# Patient Record
Sex: Female | Born: 1995 | Race: Black or African American | Hispanic: No | Marital: Single | Smoking: Never smoker
Health system: Southern US, Community
[De-identification: ages and names within clinical notes are randomized; demographics above are authoritative.]

## PROBLEM LIST (undated history)

## (undated) DIAGNOSIS — F909 Attention-deficit hyperactivity disorder, unspecified type: Secondary | ICD-10-CM

## (undated) DIAGNOSIS — J45909 Unspecified asthma, uncomplicated: Secondary | ICD-10-CM

## (undated) DIAGNOSIS — E0590 Thyrotoxicosis, unspecified without thyrotoxic crisis or storm: Secondary | ICD-10-CM

## (undated) DIAGNOSIS — K219 Gastro-esophageal reflux disease without esophagitis: Secondary | ICD-10-CM

## (undated) DIAGNOSIS — Z889 Allergy status to unspecified drugs, medicaments and biological substances status: Secondary | ICD-10-CM

## (undated) HISTORY — PX: TONSILECTOMY, ADENOIDECTOMY, BILATERAL MYRINGOTOMY AND TUBES: SHX2538

---
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
## 2017-06-09 ENCOUNTER — Encounter (HOSPITAL_BASED_OUTPATIENT_CLINIC_OR_DEPARTMENT_OTHER): Payer: Self-pay | Admitting: Emergency Medicine

## 2017-06-09 ENCOUNTER — Emergency Department (HOSPITAL_BASED_OUTPATIENT_CLINIC_OR_DEPARTMENT_OTHER)
Admission: EM | Admit: 2017-06-09 | Discharge: 2017-06-09 | Disposition: A | Discharge status: Home / Self Care | Payer: No Typology Code available for payment source | Attending: Emergency Medicine | Admitting: Emergency Medicine

## 2017-06-09 DIAGNOSIS — M5432 Sciatica, left side: Secondary | ICD-10-CM | POA: Insufficient documentation

## 2017-06-09 DIAGNOSIS — Y999 Unspecified external cause status: Secondary | ICD-10-CM | POA: Diagnosis not present

## 2017-06-09 DIAGNOSIS — J45909 Unspecified asthma, uncomplicated: Secondary | ICD-10-CM | POA: Diagnosis not present

## 2017-06-09 DIAGNOSIS — Z7722 Contact with and (suspected) exposure to environmental tobacco smoke (acute) (chronic): Secondary | ICD-10-CM | POA: Diagnosis not present

## 2017-06-09 DIAGNOSIS — Z79899 Other long term (current) drug therapy: Secondary | ICD-10-CM | POA: Insufficient documentation

## 2017-06-09 DIAGNOSIS — S39012A Strain of muscle, fascia and tendon of lower back, initial encounter: Secondary | ICD-10-CM | POA: Diagnosis not present

## 2017-06-09 DIAGNOSIS — S3992XA Unspecified injury of lower back, initial encounter: Secondary | ICD-10-CM | POA: Diagnosis present

## 2017-06-09 DIAGNOSIS — Y929 Unspecified place or not applicable: Secondary | ICD-10-CM | POA: Insufficient documentation

## 2017-06-09 DIAGNOSIS — Y939 Activity, unspecified: Secondary | ICD-10-CM | POA: Insufficient documentation

## 2017-06-09 HISTORY — DX: Gastro-esophageal reflux disease without esophagitis: K21.9

## 2017-06-09 HISTORY — DX: Thyrotoxicosis, unspecified without thyrotoxic crisis or storm: E05.90

## 2017-06-09 MED ORDER — CYCLOBENZAPRINE HCL 10 MG PO TABS: 10.0000 mg | ORAL_TABLET | Freq: Two times a day (BID) | ORAL | 0 refills | Status: DC | PRN
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
## 2017-06-09 NOTE — Discharge Instructions (Signed)
Take Tylenol 1000 mg 4 times a day for 1 week. This is the maximum dose of Tylenol daily from all sources. Please check other over-the-counter medications and prescriptions to ensure you are not taking other medications that contain acetaminophen (or tylenol) You may also take ibuprofen 400 mg 6 times a day alternating with or at the same time as tylenol.
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
## 2017-11-19 ENCOUNTER — Encounter (HOSPITAL_BASED_OUTPATIENT_CLINIC_OR_DEPARTMENT_OTHER): Payer: Self-pay | Admitting: *Deleted

## 2017-11-19 ENCOUNTER — Emergency Department (HOSPITAL_BASED_OUTPATIENT_CLINIC_OR_DEPARTMENT_OTHER)
Admission: EM | Admit: 2017-11-19 | Discharge: 2017-11-19 | Disposition: A | Discharge status: Home / Self Care | Payer: No Typology Code available for payment source | Attending: Emergency Medicine | Admitting: Emergency Medicine

## 2017-11-19 ENCOUNTER — Emergency Department (HOSPITAL_BASED_OUTPATIENT_CLINIC_OR_DEPARTMENT_OTHER): Payer: No Typology Code available for payment source

## 2017-11-19 ENCOUNTER — Other Ambulatory Visit: Payer: Self-pay

## 2017-11-19 DIAGNOSIS — Y92410 Unspecified street and highway as the place of occurrence of the external cause: Secondary | ICD-10-CM | POA: Diagnosis not present

## 2017-11-19 DIAGNOSIS — Z7722 Contact with and (suspected) exposure to environmental tobacco smoke (acute) (chronic): Secondary | ICD-10-CM | POA: Diagnosis not present

## 2017-11-19 DIAGNOSIS — Z79899 Other long term (current) drug therapy: Secondary | ICD-10-CM | POA: Insufficient documentation

## 2017-11-19 DIAGNOSIS — F909 Attention-deficit hyperactivity disorder, unspecified type: Secondary | ICD-10-CM | POA: Diagnosis not present

## 2017-11-19 DIAGNOSIS — Y999 Unspecified external cause status: Secondary | ICD-10-CM | POA: Diagnosis not present

## 2017-11-19 DIAGNOSIS — S161XXA Strain of muscle, fascia and tendon at neck level, initial encounter: Secondary | ICD-10-CM | POA: Diagnosis not present

## 2017-11-19 DIAGNOSIS — S39012A Strain of muscle, fascia and tendon of lower back, initial encounter: Secondary | ICD-10-CM | POA: Insufficient documentation

## 2017-11-19 DIAGNOSIS — J45909 Unspecified asthma, uncomplicated: Secondary | ICD-10-CM | POA: Diagnosis not present

## 2017-11-19 DIAGNOSIS — S199XXA Unspecified injury of neck, initial encounter: Secondary | ICD-10-CM | POA: Diagnosis present

## 2017-11-19 DIAGNOSIS — Y939 Activity, unspecified: Secondary | ICD-10-CM | POA: Diagnosis not present

## 2017-11-19 LAB — PREGNANCY, URINE: Preg Test, Ur: NEGATIVE

## 2017-11-19 MED ORDER — CYCLOBENZAPRINE HCL 10 MG PO TABS: 10.0000 mg | ORAL_TABLET | Freq: Two times a day (BID) | ORAL | 0 refills | Status: DC | PRN

## 2017-11-19 MED ORDER — NAPROXEN 375 MG PO TABS: 375.0000 mg | ORAL_TABLET | Freq: Two times a day (BID) | ORAL | 0 refills | Status: DC

## 2017-11-19 NOTE — Discharge Instructions (Signed)
Your x-ray did not show any signs of fractures.  This is likely musculoskeletal pain. Please take the Naproxen as prescribed for pain. Do not take any additional NSAIDs including Motrin, Aleve, Ibuprofen, Advil. Please the the flexeril for muscle relaxation. This medication will make you drowsy so avoid situation that could place you in danger.  Warm compress. Soaking in epson salt.  If you develop any worsening headaches, vision changes, light sensitivity, vomiting return to ED for further evaluation of concussion.  Patient to follow-up with his primary care doctor return to ED with any worsening symptoms.
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
## 2017-11-26 ENCOUNTER — Emergency Department (HOSPITAL_BASED_OUTPATIENT_CLINIC_OR_DEPARTMENT_OTHER)
Admission: EM | Admit: 2017-11-26 | Discharge: 2017-11-26 | Disposition: A | Discharge status: Home / Self Care | Payer: Self-pay | Attending: Emergency Medicine | Admitting: Emergency Medicine

## 2017-11-26 ENCOUNTER — Encounter (HOSPITAL_BASED_OUTPATIENT_CLINIC_OR_DEPARTMENT_OTHER): Payer: Self-pay | Admitting: Emergency Medicine

## 2017-11-26 ENCOUNTER — Other Ambulatory Visit: Payer: Self-pay

## 2017-11-26 DIAGNOSIS — J45909 Unspecified asthma, uncomplicated: Secondary | ICD-10-CM | POA: Insufficient documentation

## 2017-11-26 DIAGNOSIS — Z7722 Contact with and (suspected) exposure to environmental tobacco smoke (acute) (chronic): Secondary | ICD-10-CM | POA: Diagnosis not present

## 2017-11-26 DIAGNOSIS — S0990XD Unspecified injury of head, subsequent encounter: Secondary | ICD-10-CM | POA: Diagnosis present

## 2017-11-26 DIAGNOSIS — Z79899 Other long term (current) drug therapy: Secondary | ICD-10-CM | POA: Insufficient documentation
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
## 2017-11-26 NOTE — Discharge Instructions (Signed)
Please read attached information regarding your condition and return precautions. Continue your home medications as previously prescribed.

## 2021-08-02 ENCOUNTER — Encounter (HOSPITAL_BASED_OUTPATIENT_CLINIC_OR_DEPARTMENT_OTHER): Payer: Self-pay

## 2021-08-02 ENCOUNTER — Other Ambulatory Visit: Payer: Self-pay

## 2021-08-02 DIAGNOSIS — R079 Chest pain, unspecified: Secondary | ICD-10-CM | POA: Insufficient documentation

## 2021-08-02 DIAGNOSIS — J45909 Unspecified asthma, uncomplicated: Secondary | ICD-10-CM | POA: Diagnosis not present

## 2021-08-02 DIAGNOSIS — M542 Cervicalgia: Secondary | ICD-10-CM | POA: Diagnosis present

## 2021-08-02 DIAGNOSIS — Y92410 Unspecified street and highway as the place of occurrence of the external cause: Secondary | ICD-10-CM | POA: Diagnosis not present

## 2021-08-02 DIAGNOSIS — R410 Disorientation, unspecified: Secondary | ICD-10-CM | POA: Diagnosis not present
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
## 2021-08-03 ENCOUNTER — Emergency Department (HOSPITAL_BASED_OUTPATIENT_CLINIC_OR_DEPARTMENT_OTHER)
Admission: EM | Admit: 2021-08-03 | Discharge: 2021-08-03 | Disposition: A | Discharge status: Home / Self Care | Payer: BC Managed Care – PPO | Attending: Emergency Medicine | Admitting: Emergency Medicine

## 2021-08-03 ENCOUNTER — Emergency Department (HOSPITAL_BASED_OUTPATIENT_CLINIC_OR_DEPARTMENT_OTHER): Payer: BC Managed Care – PPO

## 2021-08-03 LAB — CBC WITH DIFFERENTIAL/PLATELET
Abs Immature Granulocytes: 0.03 10*3/uL (ref 0.00–0.07)
Basophils Absolute: 0.1 10*3/uL (ref 0.0–0.1)
Basophils Relative: 0 %
Eosinophils Absolute: 0.1 10*3/uL (ref 0.0–0.5)
Eosinophils Relative: 1 %
HCT: 36.6 % (ref 36.0–46.0)
Hemoglobin: 12 g/dL (ref 12.0–15.0)
Immature Granulocytes: 0 %
Lymphocytes Relative: 36 %
Lymphs Abs: 4.3 10*3/uL — ABNORMAL HIGH (ref 0.7–4.0)
MCH: 28.5 pg (ref 26.0–34.0)
MCHC: 32.8 g/dL (ref 30.0–36.0)
MCV: 86.9 fL (ref 80.0–100.0)
Monocytes Absolute: 0.7 10*3/uL (ref 0.1–1.0)
Monocytes Relative: 6 %
Neutro Abs: 6.8 10*3/uL (ref 1.7–7.7)
Neutrophils Relative %: 57 %
Platelets: 398 10*3/uL (ref 150–400)
RBC: 4.21 MIL/uL (ref 3.87–5.11)
RDW: 13.8 % (ref 11.5–15.5)
WBC: 12.1 10*3/uL — ABNORMAL HIGH (ref 4.0–10.5)
nRBC: 0 % (ref 0.0–0.2)

## 2021-08-03 LAB — COMPREHENSIVE METABOLIC PANEL
ALT: 18 U/L (ref 0–44)
AST: 21 U/L (ref 15–41)
Albumin: 3.9 g/dL (ref 3.5–5.0)
Alkaline Phosphatase: 91 U/L (ref 38–126)
Anion gap: 9 (ref 5–15)
BUN: 16 mg/dL (ref 6–20)
CO2: 23 mmol/L (ref 22–32)
Calcium: 8.9 mg/dL (ref 8.9–10.3)
Chloride: 102 mmol/L (ref 98–111)
Creatinine, Ser: 0.71 mg/dL (ref 0.44–1.00)
GFR, Estimated: >60 mL/min (ref >60)
Glucose, Bld: 85 mg/dL (ref 70–99)
Potassium: 3.4 mmol/L — ABNORMAL LOW (ref 3.5–5.1)
Sodium: 134 mmol/L — ABNORMAL LOW (ref 135–145)
Total Bilirubin: 0.3 mg/dL (ref 0.3–1.2)
Total Protein: 7.6 g/dL (ref 6.5–8.1)

## 2021-08-03 LAB — HCG, SERUM, QUALITATIVE: Preg, Serum: NEGATIVE

## 2021-08-03 LAB — LIPASE, BLOOD: Lipase: 26 U/L (ref 11–51)

## 2021-08-03 MED ORDER — DICLOFENAC SODIUM 1 % EX GEL: 2.0000 g | Freq: Four times a day (QID) | CUTANEOUS | 0 refills | Status: AC | PRN

## 2021-08-03 MED ORDER — METHOCARBAMOL 500 MG PO TABS: 500.0000 mg | ORAL_TABLET | Freq: Three times a day (TID) | ORAL | 0 refills | Status: DC | PRN

## 2021-08-03 MED ORDER — FENTANYL CITRATE PF 50 MCG/ML IJ SOSY
50.0000 ug | PREFILLED_SYRINGE | INTRAMUSCULAR | Status: DC | PRN
Start: 2021-08-03 — End: 2021-08-03
  Filled 2021-08-03: qty 1

## 2021-08-03 MED ORDER — IOHEXOL 300 MG/ML  SOLN
100.0000 mL | Freq: Once | INTRAMUSCULAR | Status: AC | PRN
  Administered 2021-08-03: 100 mL via INTRAVENOUS

## 2021-08-03 MED ORDER — IBUPROFEN 800 MG PO TABS: 800.0000 mg | ORAL_TABLET | Freq: Three times a day (TID) | ORAL | 0 refills | Status: AC | PRN
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
## 2021-08-03 NOTE — Discharge Instructions (Signed)

## 2021-09-30 ENCOUNTER — Encounter (HOSPITAL_BASED_OUTPATIENT_CLINIC_OR_DEPARTMENT_OTHER): Payer: Self-pay | Admitting: *Deleted

## 2021-09-30 ENCOUNTER — Other Ambulatory Visit: Payer: Self-pay

## 2021-09-30 ENCOUNTER — Emergency Department (HOSPITAL_BASED_OUTPATIENT_CLINIC_OR_DEPARTMENT_OTHER)
Admission: EM | Admit: 2021-09-30 | Discharge: 2021-09-30 | Disposition: A | Discharge status: Home / Self Care | Payer: BC Managed Care – PPO | Attending: Emergency Medicine | Admitting: Emergency Medicine

## 2021-09-30 DIAGNOSIS — T781XXA Other adverse food reactions, not elsewhere classified, initial encounter: Secondary | ICD-10-CM

## 2021-09-30 DIAGNOSIS — Z91010 Allergy to peanuts: Secondary | ICD-10-CM | POA: Insufficient documentation

## 2021-09-30 DIAGNOSIS — R21 Rash and other nonspecific skin eruption: Secondary | ICD-10-CM | POA: Diagnosis present

## 2021-09-30 DIAGNOSIS — J45909 Unspecified asthma, uncomplicated: Secondary | ICD-10-CM | POA: Insufficient documentation

## 2021-09-30 MED ORDER — FAMOTIDINE IN NACL 20-0.9 MG/50ML-% IV SOLN
20.0000 mg | Freq: Once | INTRAVENOUS | Status: AC
  Administered 2021-09-30: 20 mg via INTRAVENOUS
  Filled 2021-09-30: qty 50

## 2021-09-30 MED ORDER — DEXAMETHASONE SODIUM PHOSPHATE 10 MG/ML IJ SOLN
10.0000 mg | Freq: Once | INTRAMUSCULAR | Status: AC
  Administered 2021-09-30: 10 mg via INTRAVENOUS
  Filled 2021-09-30: qty 1

## 2021-09-30 MED ORDER — DIPHENHYDRAMINE HCL 50 MG/ML IJ SOLN
37.5000 mg | Freq: Once | INTRAMUSCULAR | Status: AC
  Administered 2021-09-30: 37.5 mg via INTRAVENOUS
  Filled 2021-09-30: qty 1

## 2021-09-30 MED ORDER — EPINEPHRINE 0.3 MG/0.3ML IJ SOAJ: INTRAMUSCULAR | 0 refills | Status: AC

## 2021-09-30 NOTE — ED Provider Notes (Signed)
[facility] Provider Note: Lowella Dell, MD, FACEP  CSN: [id number] MRN: [id number] ARRIVAL: 09/30/21 at 0051 ROOM: [facility]   CHIEF COMPLAINT  Allergic Reaction   HISTORY OF PRESENT ILLNESS  09/30/21 1:03 AM Jennifer Pham is a 25 y.o. female with a history of multiple food allergies.  She ate some popcorn with paprika on it about 9 PM and subsequently developed a rash about 15 minutes later.  The rash is generalized and urticarial.  It is pruritic and burning.  She took about 12.5 mg of Benadryl at 10 PM with partial relief.  She denies shortness of breath or throat swelling.   Past Medical History:  Diagnosis Date   ADHD (attention deficit hyperactivity disorder)    Asthma    GERD (gastroesophageal reflux disease)    Hyperthyroidism    Multiple allergies     Past Surgical History:  Procedure Laterality Date   TONSILECTOMY, ADENOIDECTOMY, BILATERAL MYRINGOTOMY AND TUBES      No family history on file.  Social History   Tobacco Use   Smoking status: Never   Smokeless tobacco: Never  Vaping Use   Vaping Use: Never used  Substance Use Topics   Alcohol use: Yes    Comment: occ   Drug use: No    Prior to Admission medications   Medication Sig Start Date End Date Taking? Authorizing Provider  EPINEPHrine 0.3 mg/0.3 mL IJ SOAJ injection Self inject per package instructions as needed for severe allergic reaction. 09/30/21  Yes Perrin Eddleman, MD  diclofenac Sodium (VOLTAREN) 1 % GEL Apply 2 g topically 4 (four) times daily as needed. 08/03/21   Long, Arlyss Repress, MD  ibuprofen (ADVIL) 800 MG tablet Take 1 tablet (800 mg total) by mouth every 8 (eight) hours as needed for moderate pain. 08/03/21   Long, Arlyss Repress, MD  lisdexamfetamine (VYVANSE) 20 MG capsule Take 20 mg by mouth every morning.      [provider]  Omeprazole (PRILOSEC PO) Take by mouth.    [provider]    Allergies Lactuca virosa, Other, Tree extract, Banana,  Corn-containing products, Peanut-containing drug products, Shrimp extract allergy skin test, and Wheat bran   REVIEW OF SYSTEMS  Negative except as noted here or in the History of Present Illness.   PHYSICAL EXAMINATION  Initial Vital Signs Blood pressure 131/86, pulse 86, temperature 98.3 F (36.8 C), temperature source Oral, resp. rate 16, height 5' 7.5" (1.715 m), weight 108.4 kg, last menstrual period 09/13/2021, SpO2 99 %.  Examination General: Well-developed, well-nourished female in no acute distress; appearance consistent with age of record HENT: normocephalic; atraumatic; no pharyngeal edema Eyes: pupils equal, round and reactive to light; extraocular muscles intact Neck: supple Heart: regular rate and rhythm Lungs: clear to auscultation bilaterally Abdomen: soft; nondistended; nontender; bowel sounds present Extremities: No deformity; full range of motion; pulses normal Neurologic: Awake, alert and oriented; motor function intact in all extremities and symmetric; no facial droop Skin: Warm and dry; fading generalized urticarial rash Psychiatric: Normal mood and affect   RESULTS  Summary of this visit's results, reviewed and interpreted by myself:   EKG Interpretation  Date/Time:    Ventricular Rate:    PR Interval:    QRS Duration:   QT Interval:    QTC Calculation:   R Axis:     Text Interpretation:         Laboratory Studies: No results found for this or any previous visit (from the past 24 hour(s)). Imaging  Studies: No results found.  ED COURSE and MDM  Nursing notes, initial and subsequent vitals signs, including pulse oximetry, reviewed and interpreted by myself.  Vitals:   09/30/21 0101 09/30/21 0105  BP:  131/86  Pulse:  86  Resp:  16  Temp:  98.3 F (36.8 C)  TempSrc:  Oral  SpO2:  99%  Weight: 108.4 kg   Height: 5' 7.5" (1.715 m)    Medications  diphenhydrAMINE (BENADRYL) injection 37.5 mg (37.5 mg Intravenous Given 09/30/21 0121)   dexamethasone (DECADRON) injection 10 mg (10 mg Intravenous Given 09/30/21 0120)  famotidine (PEPCID) IVPB 20 mg premix (20 mg Intravenous New Bag/Given 09/30/21 0125)   2:33 AM Rash resolved, patient sleeping peacefully.  Patient advised to take 50 mg of Benadryl in the future as 12.5 mg may not be adequate for an adult.  We will refill her EpiPen as requested.   PROCEDURES  Procedures   ED DIAGNOSES     ICD-10-CM   1. Allergic reaction to food, initial encounter  T78.1XXA          Kaci Freel, Jonny Ruiz, MD 09/30/21 [phone]

## 2021-09-30 NOTE — ED Notes (Signed)
Discharge instructions dicussed with pt and mother at bedside. Pt was able to verbalize understanding with no questions at this time.

## 2021-09-30 NOTE — ED Notes (Signed)
ED Provider at bedside. 

## 2021-09-30 NOTE — ED Triage Notes (Addendum)
Pt reports allergic reaction to paprika  in popcorn she ate at 9pm , rash appeared at 915 pm , benadryl 12.5mg  at 10 pm. No resp distress noted
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
